# Patient Record
Sex: Female | Born: 1990 | Race: White | Hispanic: No | Marital: Single | Smoking: Current some day smoker
Health system: Southern US, Community
[De-identification: ages and names within clinical notes are randomized; demographics above are authoritative.]

---

## 2011-04-22 ENCOUNTER — Emergency Department (HOSPITAL_COMMUNITY)
Admission: EM | Admit: 2011-04-22 | Discharge: 2011-04-23 | Disposition: A | Discharge status: Home / Self Care | Payer: BC Managed Care – PPO | Attending: Emergency Medicine | Admitting: Emergency Medicine

## 2011-04-22 ENCOUNTER — Encounter: Payer: Self-pay | Admitting: *Deleted

## 2011-04-22 DIAGNOSIS — F17200 Nicotine dependence, unspecified, uncomplicated: Secondary | ICD-10-CM | POA: Insufficient documentation

## 2011-04-22 DIAGNOSIS — F1910 Other psychoactive substance abuse, uncomplicated: Secondary | ICD-10-CM | POA: Insufficient documentation

## 2011-04-22 LAB — CBC
HCT: 42.3 % (ref 36.0–46.0)
Hemoglobin: 14.3 g/dL (ref 12.0–15.0)
MCH: 30.1 pg (ref 26.0–34.0)
MCV: 89.1 fL (ref 78.0–100.0)
RBC: 4.75 MIL/uL (ref 3.87–5.11)

## 2011-04-22 LAB — POCT PREGNANCY, URINE: Preg Test, Ur: NEGATIVE

## 2011-04-22 MED ORDER — LORAZEPAM 2 MG/ML IJ SOLN
1.0000 mg | Freq: Once | INTRAMUSCULAR | Status: AC
  Administered 2011-04-22: 1 mg via INTRAVENOUS
  Filled 2011-04-22: qty 1

## 2011-04-22 MED ORDER — SODIUM CHLORIDE 0.9 % IV SOLN
Freq: Once | INTRAVENOUS | Status: AC
  Administered 2011-04-22: via INTRAVENOUS

## 2011-04-22 NOTE — ED Notes (Signed)
Pt in stating she smoke some marijuana PTA, states she thinks something was in the blunt, c/o heart racing and dizziness, HR 157 in triage

## 2011-04-22 NOTE — ED Notes (Signed)
Pt with c/o dizziness and feels like her heart is racing. Pt states she smoked some marijuana with a guy tonight and she thinks the blunt was laced with something. Pt denies CP. Pt states she feels really anxious. Pt heart rate 170 in triage.

## 2011-04-22 NOTE — ED Notes (Signed)
MD at bedside. 

## 2011-04-22 NOTE — ED Notes (Signed)
Pt unable to void at this time. 

## 2011-04-23 LAB — RAPID URINE DRUG SCREEN, HOSP PERFORMED
Barbiturates: NOT DETECTED
Cocaine: NOT DETECTED
Opiates: NOT DETECTED

## 2011-04-23 LAB — URINALYSIS, ROUTINE W REFLEX MICROSCOPIC
Bilirubin Urine: NEGATIVE
Nitrite: NEGATIVE
Specific Gravity, Urine: 1.027 (ref 1.005–1.030)
pH: 5.5 (ref 5.0–8.0)

## 2011-04-23 LAB — COMPREHENSIVE METABOLIC PANEL
BUN: 18 mg/dL (ref 6–23)
CO2: 25 mEq/L (ref 19–32)
Calcium: 10.1 mg/dL (ref 8.4–10.5)
Chloride: 101 mEq/L (ref 96–112)
Creatinine, Ser: 0.93 mg/dL (ref 0.50–1.10)
GFR calc Af Amer: >90 mL/min (ref >90)
GFR calc non Af Amer: 87 mL/min — ABNORMAL LOW (ref >90)
Glucose, Bld: 111 mg/dL — ABNORMAL HIGH (ref 70–99)
Total Bilirubin: 1.5 mg/dL — ABNORMAL HIGH (ref 0.3–1.2)

## 2011-04-23 LAB — URINE MICROSCOPIC-ADD ON

## 2011-04-23 MED ORDER — LORAZEPAM 2 MG/ML IJ SOLN
1.0000 mg | Freq: Once | INTRAMUSCULAR | Status: AC
  Administered 2011-04-23: 1 mg via INTRAVENOUS
  Filled 2011-04-23: qty 1

## 2011-04-23 NOTE — ED Notes (Signed)
Pt less anxious at this time. States she feels better. Anxiousness comes and goes. ABC's intact. No distress noted. Family/friends at bedside.

## 2011-04-23 NOTE — ED Provider Notes (Addendum)
History     CSN: [id number] Arrival date & time: 04/22/2011 10:44 PM   First MD Initiated Contact with Patient 04/22/11 2302      Chief Complaint  Patient presents with  . Drug Problem     HPI 20 year old female presents to emergency department with complaint of "feeling bad". She reports she was smoking marijuana with a guy in a car about an hour ago and after the second joint began to feel a fast heart rate, anxious, paranoid and unwell. Patient reports she smoked marijuana before without any difficulties. Patient feels that she was slipped something in her marijuana. Patient denies chest pain shortness of breath headache. History reviewed. No pertinent past medical history.  History reviewed. No pertinent past surgical history.  History reviewed. No pertinent family history.  History  Substance Use Topics  . Smoking status: Current Some Day Smoker  . Smokeless tobacco: Not on file  . Alcohol Use: Yes    OB History    Grav Para Term Preterm Abortions TAB SAB Ect Mult Living                  Review of Systems  Psychiatric/Behavioral: The patient is nervous/anxious.   All other systems reviewed and are negative.    Allergies  Review of patient's allergies indicates no known allergies.  Home Medications  No current outpatient prescriptions on file.  BP 138/94  Pulse 170  Temp(Src) 98 F (36.7 C) (Oral)  Resp 20  SpO2 100%  Physical Exam  Nursing note and vitals reviewed. Constitutional: She is oriented to person, place, and time. She appears well-developed and well-nourished.  HENT:  Head: Normocephalic and atraumatic.  Right Ear: External ear normal.  Left Ear: External ear normal.  Nose: Nose normal.  Mouth/Throat: Oropharynx is clear and moist.  Eyes: Conjunctivae and EOM are normal. Pupils are equal, round, and reactive to light.  Neck: Normal range of motion. Neck supple. No JVD present. No tracheal deviation present. No thyromegaly present.    Cardiovascular: Regular rhythm, normal heart sounds and intact distal pulses.  Exam reveals no gallop and no friction rub.   No murmur heard.      Tachycardia noted, improved from initial presentation  Pulmonary/Chest: Effort normal and breath sounds normal. No stridor. No respiratory distress. She has no wheezes. She has no rales. She exhibits no tenderness.  Abdominal: Soft. Bowel sounds are normal. She exhibits no distension and no mass. There is no tenderness. There is no rebound and no guarding.  Musculoskeletal: Normal range of motion. She exhibits no edema and no tenderness.  Lymphadenopathy:    She has no cervical adenopathy.  Neurological: She is oriented to person, place, and time. She has normal reflexes. No cranial nerve deficit. She exhibits normal muscle tone. Coordination normal.  Skin: Skin is dry. No rash noted. No erythema. No pallor.  Psychiatric: Her behavior is normal. Judgment and thought content normal.       Patient anxious distressed    ED Course  Procedures (including critical care time)  Labs Reviewed  COMPREHENSIVE METABOLIC PANEL - Abnormal; Notable for the following:    Potassium 5.3 (*) MODERATE HEMOLYSIS   Glucose, Bld 111 (*)    Total Bilirubin 1.5 (*)    GFR calc non Af Amer 87 (*)    All other components within normal limits  URINE RAPID DRUG SCREEN (HOSP PERFORMED) - Abnormal; Notable for the following:    Tetrahydrocannabinol POSITIVE (*)    All other components  within normal limits  CBC  ETHANOL  POCT PREGNANCY, URINE  POCT PREGNANCY, URINE  URINALYSIS, ROUTINE W REFLEX MICROSCOPIC   No results found.   No diagnosis found.   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 Will give fluids, Ativan check labs EKG. Expect she will be able to go home once she is feeling better.   2:31 AM Patient feeling better, able ambulate anxiety and "jitteriness" resolved. Workup unremarkable. We'll discharge him    Olivia Mackie, MD 04/23/11 9147  Olivia Mackie, MD 04/23/11 8295  Olivia Mackie, MD 04/23/11 [phone]

## 2011-04-24 LAB — URINE CULTURE: Colony Count: 50000

## 2013-12-24 ENCOUNTER — Emergency Department (HOSPITAL_COMMUNITY): Payer: BC Managed Care – PPO

## 2013-12-24 ENCOUNTER — Emergency Department (HOSPITAL_COMMUNITY)
Admission: EM | Admit: 2013-12-24 | Discharge: 2013-12-24 | Disposition: A | Discharge status: Home / Self Care | Payer: BC Managed Care – PPO | Attending: Emergency Medicine | Admitting: Emergency Medicine

## 2013-12-24 ENCOUNTER — Encounter (HOSPITAL_COMMUNITY): Payer: Self-pay | Admitting: Emergency Medicine

## 2013-12-24 DIAGNOSIS — F17200 Nicotine dependence, unspecified, uncomplicated: Secondary | ICD-10-CM | POA: Insufficient documentation

## 2013-12-24 DIAGNOSIS — R42 Dizziness and giddiness: Secondary | ICD-10-CM | POA: Insufficient documentation

## 2013-12-24 DIAGNOSIS — Z3202 Encounter for pregnancy test, result negative: Secondary | ICD-10-CM | POA: Insufficient documentation

## 2013-12-24 DIAGNOSIS — R55 Syncope and collapse: Secondary | ICD-10-CM | POA: Insufficient documentation

## 2013-12-24 LAB — CBC WITH DIFFERENTIAL/PLATELET
Basophils Absolute: 0.1 10*3/uL (ref 0.0–0.1)
Basophils Relative: 1 % (ref 0–1)
EOS ABS: 0.8 10*3/uL — AB (ref 0.0–0.7)
EOS PCT: 9 % — AB (ref 0–5)
HCT: 38.1 % (ref 36.0–46.0)
HEMOGLOBIN: 12.9 g/dL (ref 12.0–15.0)
LYMPHS ABS: 2.5 10*3/uL (ref 0.7–4.0)
Lymphocytes Relative: 29 % (ref 12–46)
MCH: 30.3 pg (ref 26.0–34.0)
MCHC: 33.9 g/dL (ref 30.0–36.0)
MCV: 89.4 fL (ref 78.0–100.0)
MONOS PCT: 8 % (ref 3–12)
Monocytes Absolute: 0.7 10*3/uL (ref 0.1–1.0)
Neutro Abs: 4.7 10*3/uL (ref 1.7–7.7)
Neutrophils Relative %: 53 % (ref 43–77)
Platelets: 136 10*3/uL — ABNORMAL LOW (ref 150–400)
RBC: 4.26 MIL/uL (ref 3.87–5.11)
RDW: 12.1 % (ref 11.5–15.5)
WBC: 8.7 10*3/uL (ref 4.0–10.5)

## 2013-12-24 LAB — URINALYSIS, ROUTINE W REFLEX MICROSCOPIC
BILIRUBIN URINE: NEGATIVE
Glucose, UA: NEGATIVE mg/dL
HGB URINE DIPSTICK: NEGATIVE
Ketones, ur: 15 mg/dL — AB
Nitrite: NEGATIVE
PH: 5.5 (ref 5.0–8.0)
Protein, ur: NEGATIVE mg/dL
SPECIFIC GRAVITY, URINE: 1.027 (ref 1.005–1.030)
Urobilinogen, UA: 1 mg/dL (ref 0.0–1.0)

## 2013-12-24 LAB — I-STAT CHEM 8, ED
BUN: 11 mg/dL (ref 6–23)
CALCIUM ION: 1.19 mmol/L (ref 1.12–1.23)
CHLORIDE: 108 meq/L (ref 96–112)
Creatinine, Ser: 1.1 mg/dL (ref 0.50–1.10)
GLUCOSE: 106 mg/dL — AB (ref 70–99)
HCT: 40 % (ref 36.0–46.0)
Hemoglobin: 13.6 g/dL (ref 12.0–15.0)
Potassium: 3.1 mEq/L — ABNORMAL LOW (ref 3.7–5.3)
Sodium: 141 mEq/L (ref 137–147)
TCO2: 19 mmol/L (ref 0–100)

## 2013-12-24 LAB — URINE MICROSCOPIC-ADD ON

## 2013-12-24 LAB — CBG MONITORING, ED: Glucose-Capillary: 92 mg/dL (ref 70–99)

## 2013-12-24 LAB — POC URINE PREG, ED: Preg Test, Ur: NEGATIVE

## 2013-12-24 MED ORDER — SODIUM CHLORIDE 0.9 % IV BOLUS (SEPSIS): 1000.0000 mL | Freq: Once | INTRAVENOUS | Status: DC

## 2013-12-24 MED ORDER — IBUPROFEN 800 MG PO TABS
800.0000 mg | ORAL_TABLET | Freq: Once | ORAL | Status: AC
  Administered 2013-12-24: 800 mg via ORAL
  Filled 2013-12-24: qty 1

## 2013-12-24 MED ORDER — SODIUM CHLORIDE 0.9 % IV BOLUS (SEPSIS)
1000.0000 mL | Freq: Once | INTRAVENOUS | Status: AC
  Administered 2013-12-24: 1000 mL via INTRAVENOUS

## 2013-12-24 NOTE — ED Notes (Signed)
PER EMS: pt from home, was eating dinner when she started to feel weak, dizzy, cold and clammy and reported seeing spots. Upon EMS arrival pt was hypotensive at 80 systolic palpated and HR 120. 16X20g IV started in LAC and received 250 ml NaCl and BP increased to 104/65 and HR decreased to 79, sinus arrhythmia. Pt reports SOB, ems reports lungs sounds clear and equal bilaterally.CBG-107. Pt reports to drinking one alcoholic beverage at dinner. Denies pain. A&OX4.

## 2013-12-24 NOTE — ED Notes (Signed)
Pt ambulated around POD B with steady gait, NAD, reporting she is feeling better

## 2013-12-24 NOTE — ED Notes (Signed)
Upon discharge, patient A&OX4, ambulatory with slow steady gait, NAD. Pt reports her boyfriend will be driving her home.

## 2013-12-24 NOTE — Discharge Instructions (Signed)
Driving and Equipment Restrictions °Some medical problems make it dangerous to drive, ride a bike, or use machines. Some of these problems are: °· A hard blow to the head (concussion). °· Passing out (fainting). °· Twitching and shaking (seizures). °· Low blood sugar. °· Taking medicine to help you relax (sedatives). °· Taking pain medicines. °· Wearing an eye patch. °· Wearing splints. This can make it hard to use parts of your body that you need to drive safely. °HOME CARE  °· Do not drive until your doctor says it is okay. °· Do not use machines until your doctor says it is okay. °You may need a form signed by your doctor (medical release) before you can drive again. You may also need this form before you do other tasks where you need to be fully alert. °MAKE SURE YOU: °· Understand these instructions. °· Will watch your condition. °· Will get help right away if you are not doing well or get worse. °Document Released: 07/09/2004 Document Revised: 08/24/2011 Document Reviewed: 10/09/2009 °[employer]® Patient Information ©2015 [employer]. This information is not intended to replace advice given to you by your health care provider. Make sure you discuss any questions you have with your health care provider. ° °

## 2013-12-24 NOTE — ED Provider Notes (Signed)
CSN: [id number]     Arrival date & time 12/24/13  0006 History   First MD Initiated Contact with Patient 12/24/13 0039     Chief Complaint  Patient presents with  . Near Syncope     Patient is a 23 y.o. female presenting with near-syncope. The history is provided by the patient and a friend.  Near Syncope This is a new problem. The current episode started less than 1 hour ago. The problem has been gradually improving. Pertinent negatives include no abdominal pain and no headaches. Nothing aggravates the symptoms. Nothing relieves the symptoms.  Pt had near syncopal episode while at dinner Patient reports she ate very little during the day.  She did have dinner and had an alcoholic drink.  During dinner, she felt light headed and friends noticed that she might pass out, they checked her pulse and it was thready.  She was diaphoretic.  No seizure reported.  Did not lose consciousness.  No head injury.  No proceeding cp/sob/ha.  She now has left sided CP worse with palpation since the episode She has never had this before.  No h/o seizure/syncope She takes no medications    EMS noted pt was hypotensive and required IV Fluids.     PMH - none Soc hx - social drinker History reviewed. No pertinent past surgical history. No family history on file. History  Substance Use Topics  . Smoking status: Current Some Day Smoker  . Smokeless tobacco: Not on file  . Alcohol Use: Yes   OB History   Grav Para Term Preterm Abortions TAB SAB Ect Mult Living                 Review of Systems  Constitutional: Negative for fever.  Cardiovascular: Positive for near-syncope.       Currently has CP after episode   Gastrointestinal: Negative for vomiting, abdominal pain and diarrhea.  Skin: Negative for rash.  Neurological: Positive for dizziness. Negative for syncope and headaches.  All other systems reviewed and are negative.     Allergies  Fish allergy  Home Medications   Prior to  Admission medications   Medication Sig Start Date End Date Taking? Authorizing Provider  levonorgestrel (MIRENA) 20 MCG/24HR IUD 1 each by Intrauterine route once.   Yes Historical Provider, MD   BP 116/55  Pulse 86  Temp(Src) 98.5 F (36.9 C) (Oral)  Resp 18  Ht 5\' 10"  (1.778 m)  Wt 150 lb (68.04 kg)  BMI 21.52 kg/m2  SpO2 100% Physical Exam CONSTITUTIONAL: Well developed/well nourished HEAD: Normocephalic/atraumatic EYES: EOMI/PERRL ENMT: Mucous membranes moist NECK: supple no meningeal signs SPINE:entire spine nontender CV: S1/S2 noted, no murmurs/rubs/gallops noted Chest - tenderness to left chest wall.  No crepitus or bruising.  Pain worse with movement of left arm LUNGS: Lungs are clear to auscultation bilaterally, no apparent distress ABDOMEN: soft, nontender, no rebound or guarding GU:no cva tenderness NEURO: Pt is awake/alert, moves all extremitiesx4.  No arm/leg drift noted.  Pt is conversant EXTREMITIES: pulses normal, full ROM, no LE edema noted SKIN: warm, color normal PSYCH: no abnormalities of mood noted  ED Course  Procedures   2:00 AM Pt with near syncopal episode while at dinner She may have been exposed to fish at dinner (has fish allergy) but denies rash and not similar to prior reactions She is improving with IV fluids She reports left sided CP worse with palpation.  Doubt ACS/Pe at this time Labs/imaging pending at this time 4:07  AM Pt improved She is ambulatory Vitals appropriate I doubt ACS/PE at this time Low suspicion for significant dysrhythmia   Labs Review Labs Reviewed  URINALYSIS, ROUTINE W REFLEX MICROSCOPIC - Abnormal; Notable for the following:    APPearance CLOUDY (*)    Ketones, ur 15 (*)    Leukocytes, UA SMALL (*)    All other components within normal limits  CBC WITH DIFFERENTIAL - Abnormal; Notable for the following:    Platelets 136 (*)    Eosinophils Relative 9 (*)    Eosinophils Absolute 0.8 (*)    All other  components within normal limits  URINE MICROSCOPIC-ADD ON - Abnormal; Notable for the following:    Squamous Epithelial / LPF FEW (*)    Bacteria, UA FEW (*)    All other components within normal limits  I-STAT CHEM 8, ED - Abnormal; Notable for the following:    Potassium 3.1 (*)    Glucose, Bld 106 (*)    All other components within normal limits  CBG MONITORING, ED  POC URINE PREG, ED    Imaging Review Dg Chest 2 View  12/24/2013   CLINICAL DATA:  Left-sided chest pain.  Shortness of breath.  EXAM: CHEST  2 VIEW  COMPARISON:  None.  FINDINGS: The heart size and mediastinal contours are within normal limits. Both lungs are clear. The visualized skeletal structures are unremarkable.  IMPRESSION: No active cardiopulmonary disease.   Electronically Signed   By: Burman Nieves M.D.   On: 12/24/2013 02:58     EKG Interpretation   Date/Time:  Sunday December 24 2013 00:16:42 EDT Ventricular Rate:  63 PR Interval:  100 QRS Duration: 110 QT Interval:  400 QTC Calculation: 409 R Axis:   99 Text Interpretation:  Sinus rhythm Short PR interval Borderline right axis  deviation Low voltage, precordial leads Confirmed by Bebe Shaggy  MD, Dorinda Hill  [phone]) on 12/24/2013 12:24:55 AM      MDM   Final diagnoses:  Near syncope    Nursing notes including past medical history and social history reviewed and considered in documentation Labs/vital reviewed and considered xrays reviewed and considered     Joya Gaskins, MD 12/24/13 0407

## 2015-04-24 IMAGING — CR DG CHEST 2V
2 series · 2 of 2 positions shown · non-contrast
Comparison: None.

CLINICAL DATA: Left-sided chest pain.  Shortness of breath.

EXAM:
CHEST  2 VIEW

[w chest pa]
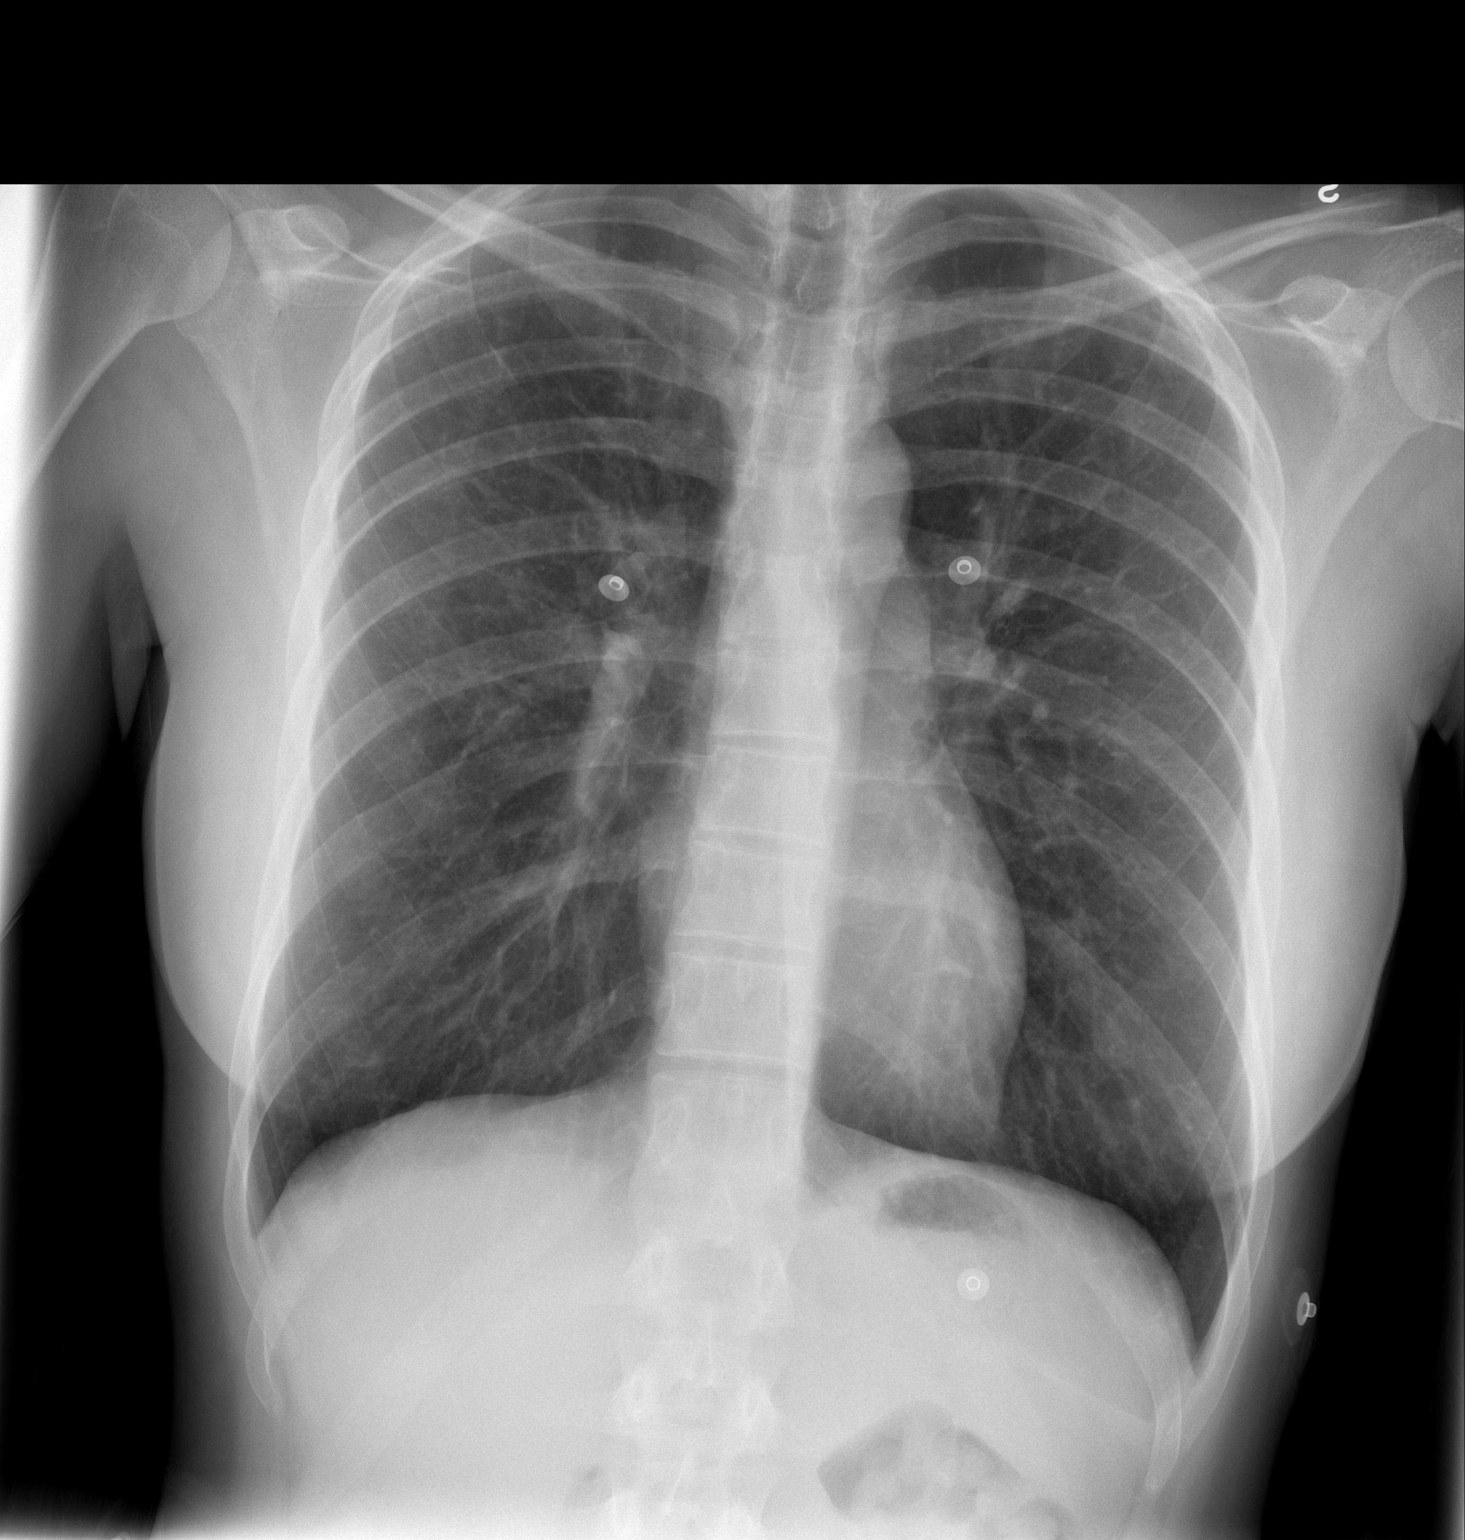

[w chest lat]
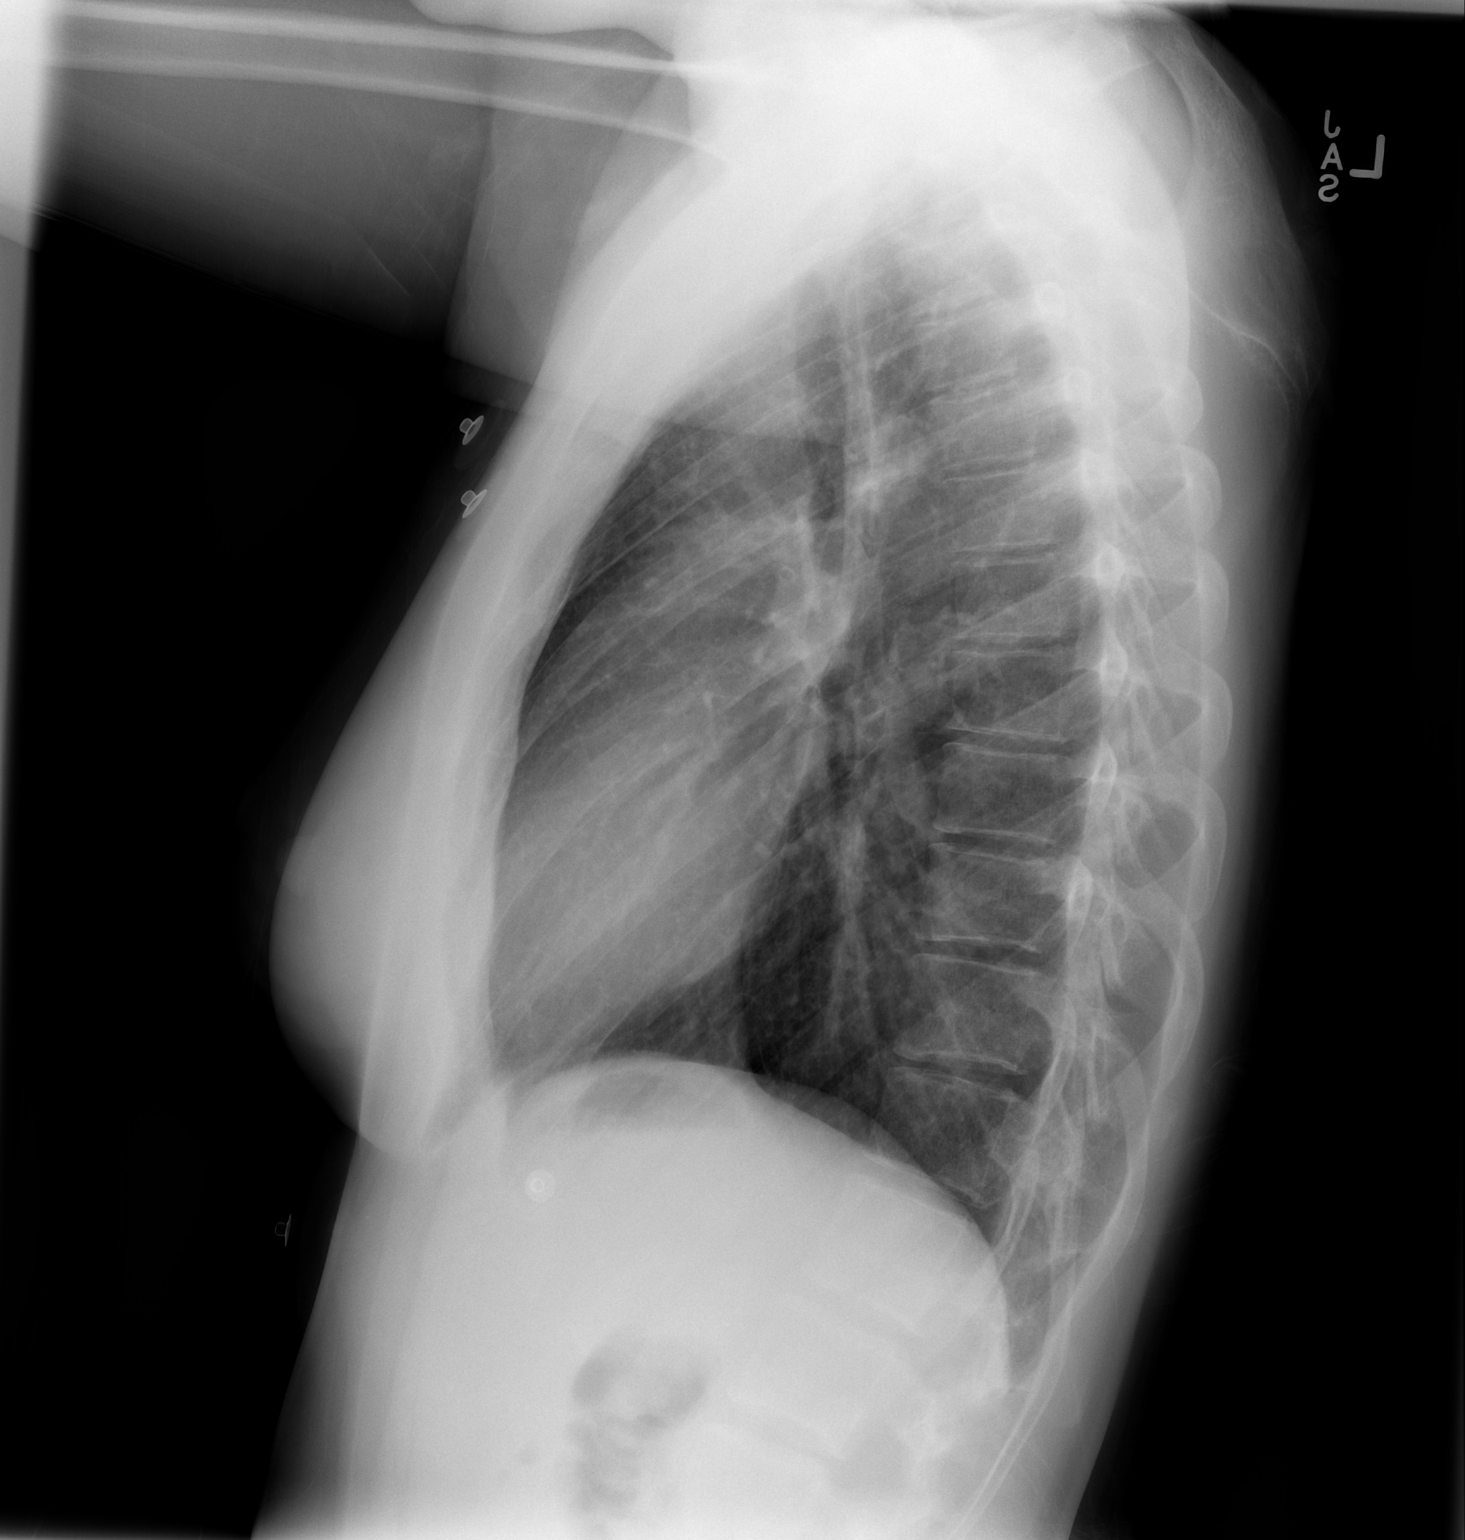

[2 of 2 positions shown; findings below may reference images not displayed]

FINDINGS: The heart size and mediastinal contours are within normal limits.
Both lungs are clear. The visualized skeletal structures are
unremarkable.
IMPRESSION: No active cardiopulmonary disease.
